# Patient Record
Sex: Female | Born: 1995 | Race: Black or African American | Hispanic: No | Marital: Single | Smoking: Never smoker
Health system: Southern US, Community
[De-identification: ages and names within clinical notes are randomized; demographics above are authoritative.]

## PROBLEM LIST (undated history)

## (undated) ENCOUNTER — Ambulatory Visit (HOSPITAL_COMMUNITY): Admission: EM | Payer: Self-pay

---

## 2021-08-22 DIAGNOSIS — Z20822 Contact with and (suspected) exposure to covid-19: Secondary | ICD-10-CM | POA: Diagnosis not present

## 2021-10-20 ENCOUNTER — Telehealth: Payer: Self-pay

## 2021-10-20 ENCOUNTER — Ambulatory Visit
Admission: EM | Admit: 2021-10-20 | Discharge: 2021-10-20 | Disposition: A | Discharge status: Home / Self Care | Payer: 59 | Attending: Emergency Medicine | Admitting: Emergency Medicine

## 2021-10-20 ENCOUNTER — Other Ambulatory Visit: Payer: Self-pay

## 2021-10-20 DIAGNOSIS — H5789 Other specified disorders of eye and adnexa: Secondary | ICD-10-CM

## 2021-10-20 DIAGNOSIS — H579 Unspecified disorder of eye and adnexa: Secondary | ICD-10-CM

## 2021-10-20 DIAGNOSIS — H16291 Other keratoconjunctivitis, right eye: Secondary | ICD-10-CM

## 2021-10-20 DIAGNOSIS — Z973 Presence of spectacles and contact lenses: Secondary | ICD-10-CM

## 2021-10-20 MED ORDER — PREDNISOLONE ACETATE 1 % OP SUSP: 1.0000 [drp] | Freq: Four times a day (QID) | OPHTHALMIC | 0 refills | Status: DC

## 2021-10-20 NOTE — ED Triage Notes (Signed)
Pt states that today when she woke up her right eye was reddened and she has had crusting around the eye lid. Patient does wear contacts.  Started: yesterday

## 2021-10-20 NOTE — Discharge Instructions (Addendum)
Please discontinue contact lens use and place 1 drop of Omnipred in your right eye 4 times daily until you are able to see your ophthalmologist.  Please make his appointment as soon as possible.  Thank you for visiting urgent care today.

## 2021-10-20 NOTE — ED Provider Notes (Signed)
[facility]    CSN: [id number] Arrival date & time: 10/20/21  6387      History   Chief Complaint No chief complaint on file.   HPI Erin Braun is a 25 y.o. female.   Patient reports waking up this morning with her right eye red along with crusting around both her upper and lower eyelids.  Patient states she has worn contacts for years.  Denies vision changes, eye pain, recent upper respiratory infection, trauma to the eye, headache, dizziness.  The history is provided by the patient.   History reviewed. No pertinent past medical history.  There are no problems to display for this patient.   History reviewed. No pertinent surgical history.  OB History   No obstetric history on file.      Home Medications    Prior to Admission medications   Medication Sig Start Date End Date Taking? Authorizing Provider  prednisoLONE acetate (OMNIPRED) 1 % ophthalmic suspension Place 1 drop into the right eye 4 (four) times daily. 10/20/21  Yes Theadora Rama Scales, PA-C    Family History No family history on file.  Social History Social History   Tobacco Use   Smoking status: Never   Smokeless tobacco: Never  Vaping Use   Vaping Use: Never used  Substance Use Topics   Alcohol use: Yes   Drug use: Yes    Types: Marijuana     Allergies   Patient has no allergy information on record.   Review of Systems Review of Systems Pertinent findings noted in history of present illness.    Physical Exam Triage Vital Signs ED Triage Vitals  Enc Vitals Group     BP 10/13/21 0827 (!) 147/82     Pulse Rate 10/13/21 0827 72     Resp 10/13/21 0827 18     Temp 10/13/21 0827 98.3 F (36.8 C)     Temp Source 10/13/21 0827 Oral     SpO2 10/13/21 0827 98 %     Weight --      Height --      Head Circumference --      Peak Flow --      Pain Score 10/13/21 0826 5     Pain Loc --      Pain Edu? --      Excl. in GC? --    No data found.  Updated Vital  Signs BP (!) 108/56 (BP Location: Left Arm)   Pulse 62   Temp 97.9 F (36.6 C) (Oral)   Resp 18   LMP 10/10/2021   SpO2 98%   Visual Acuity Right Eye Distance:   Left Eye Distance:   Bilateral Distance:    Right Eye Near:   Left Eye Near:    Bilateral Near:     Physical Exam Vitals and nursing note reviewed.  Constitutional:      General: She is not in acute distress.    Appearance: Normal appearance. She is not ill-appearing.  HENT:     Head: Normocephalic and atraumatic.  Eyes:     General: Lids are normal.        Right eye: Discharge present. No hordeolum.        Left eye: No discharge.     Extraocular Movements: Extraocular movements intact.     Conjunctiva/sclera:     Right eye: Right conjunctiva is injected.     Left eye: Left conjunctiva is not injected.     Comments: Contact lens  present in right eye, there is an injection of her sclera surrounding the contact lens but sparing the area beneath the contact lens and cornea.  Neck:     Trachea: Trachea and phonation normal.  Cardiovascular:     Rate and Rhythm: Normal rate and regular rhythm.     Pulses: Normal pulses.     Heart sounds: Normal heart sounds. No murmur heard.   No friction rub. No gallop.  Pulmonary:     Effort: Pulmonary effort is normal. No accessory muscle usage, prolonged expiration or respiratory distress.     Breath sounds: Normal breath sounds. No stridor, decreased air movement or transmitted upper airway sounds. No decreased breath sounds, wheezing, rhonchi or rales.  Chest:     Chest wall: No tenderness.  Musculoskeletal:        General: Normal range of motion.     Cervical back: Normal range of motion and neck supple. Normal range of motion.  Lymphadenopathy:     Cervical: No cervical adenopathy.  Skin:    General: Skin is warm and dry.     Findings: No erythema or rash.  Neurological:     General: No focal deficit present.     Mental Status: She is alert and oriented to person,  place, and time.  Psychiatric:        Mood and Affect: Mood normal.        Behavior: Behavior normal.     UC Treatments / Results  Labs (all labs ordered are listed, but only abnormal results are displayed) Labs Reviewed - No data to display  EKG   Radiology No results found.  Procedures Procedures (including critical care time)  Medications Ordered in UC Medications - No data to display  Initial Impression / Assessment and Plan / UC Course  I have reviewed the triage vital signs and the nursing notes.  Pertinent labs & imaging results that were available during my care of the patient were reviewed by me and considered in my medical decision making (see chart for details).     Superior limbic keratoconjunctivitis of right eye, mild.  I have provided her with an ophthalmic steroid drops to be used 4 times daily 1 drop.  Patient vies to stop wearing contact lenses and to reach out to her ophthalmologist immediately to make an appointment for follow-up evaluation.  Patient verbalized understanding and agreement of plan as discussed.  All questions were addressed during visit.  Please see discharge instructions below for further details of plan.  Final Clinical Impressions(s) / UC Diagnoses   Final diagnoses:  Eye problem  Red eye associated with contact lens  Superior limbic keratoconjunctivitis of right eye     Discharge Instructions      Please discontinue contact lens use and place 1 drop of Omnipred in your right eye 4 times daily until you are able to see your ophthalmologist.  Please make his appointment as soon as possible.  Thank you for visiting urgent care today.       ED Prescriptions     Medication Sig Dispense Auth. Provider   prednisoLONE acetate (OMNIPRED) 1 % ophthalmic suspension Place 1 drop into the right eye 4 (four) times daily. 5 mL Theadora Rama Scales, PA-C      PDMP not reviewed this encounter.    Theadora Rama Scales,  PA-C 10/20/21 1153

## 2021-11-28 ENCOUNTER — Ambulatory Visit
Admission: EM | Admit: 2021-11-28 | Discharge: 2021-11-28 | Disposition: A | Discharge status: Home / Self Care | Payer: 59 | Attending: Emergency Medicine | Admitting: Emergency Medicine

## 2021-11-28 ENCOUNTER — Other Ambulatory Visit: Payer: Self-pay

## 2021-11-28 DIAGNOSIS — J029 Acute pharyngitis, unspecified: Secondary | ICD-10-CM

## 2021-11-28 DIAGNOSIS — B3731 Acute candidiasis of vulva and vagina: Secondary | ICD-10-CM

## 2021-11-28 DIAGNOSIS — J020 Streptococcal pharyngitis: Secondary | ICD-10-CM

## 2021-11-28 DIAGNOSIS — M791 Myalgia, unspecified site: Secondary | ICD-10-CM

## 2021-11-28 DIAGNOSIS — Z113 Encounter for screening for infections with a predominantly sexual mode of transmission: Secondary | ICD-10-CM

## 2021-11-28 DIAGNOSIS — Z8616 Personal history of COVID-19: Secondary | ICD-10-CM

## 2021-11-28 LAB — POCT RAPID STREP A (OFFICE): Rapid Strep A Screen: POSITIVE — AB

## 2021-11-28 MED ORDER — CEFDINIR 300 MG PO CAPS
300.0000 mg | ORAL_CAPSULE | Freq: Two times a day (BID) | ORAL | 0 refills | Status: AC
Start: 2021-11-28 — End: 2021-12-08

## 2021-11-28 MED ORDER — FLUCONAZOLE 150 MG PO TABS
ORAL_TABLET | ORAL | 0 refills | Status: DC
Start: 2021-11-28 — End: 2022-01-16

## 2021-11-28 NOTE — Discharge Instructions (Addendum)
For strep throat infection, please begin cefdinir now, 1 tablet twice daily for the next 10 days.  While we await the results of your throat culture and your flu test.  The results of your flu test and STD screening will be posted initially to your MyChart and you will be contacted with those results if any of them are positive, further recommendations will be provided to you if needed.  Thank you for visiting urgent care today.  Your prescription for cefdinir should be ready to pick up in about an hour, please take 1 tablet twice daily for the next 10 days unless you are instructed to discontinue.

## 2021-11-28 NOTE — ED Provider Notes (Addendum)
[facility]    CSN: [id number] Arrival date & time: 11/28/21  0865    HISTORY   Chief Complaint  Patient presents with   Sore Throat   HPI Erin Braun is a 25 y.o. female. Pt present sore throat with difficulty swallowing symptoms that began 5 days ago, states throat feels very raw. Pt states she she did a home COVID test yesterday and it was negative.  Patient denies cough, nausea, vomiting, diarrhea, headache, body aches, chills.  Patient is afebrile on arrival today.  Patient states she had COVID-19 in September 2022.  Patient denies known exposure to STD, denies having oral sex with anyone in the past month.  Patient has a low blood pressure today but this is historical, it was 108/56 when she was here last.  The history is provided by the patient.  History reviewed. No pertinent past medical history. There are no problems to display for this patient.  History reviewed. No pertinent surgical history. OB History   No obstetric history on file.    Home Medications    Prior to Admission medications   Not on File   Family History History reviewed. No pertinent family history. Social History Social History   Tobacco Use   Smoking status: Never   Smokeless tobacco: Never  Vaping Use   Vaping Use: Never used  Substance Use Topics   Alcohol use: Yes   Drug use: Yes    Types: Marijuana   Allergies   Patient has no allergy information on record.  Review of Systems Review of Systems Pertinent findings noted in history of present illness.   Physical Exam Triage Vital Signs ED Triage Vitals  Enc Vitals Group     BP 10/13/21 0827 (!) 147/82     Pulse Rate 10/13/21 0827 72     Resp 10/13/21 0827 18     Temp 10/13/21 0827 98.3 F (36.8 C)     Temp Source 10/13/21 0827 Oral     SpO2 10/13/21 0827 98 %     Weight --      Height --      Head Circumference --      Peak Flow --      Pain Score 10/13/21 0826 5     Pain Loc --      Pain Edu? --       Excl. in GC? --   No data found.  Updated Vital Signs BP (!) 99/51 (BP Location: Left Arm)    Pulse 75    Temp 98.8 F (37.1 C) (Oral)    Resp 16    LMP 11/15/2021    SpO2 98%   Physical Exam Constitutional:      General: She is not in acute distress.    Appearance: She is well-developed and well-groomed. She is ill-appearing.  HENT:     Head: Normocephalic and atraumatic.     Jaw: No trismus, tenderness, swelling or pain on movement.     Salivary Glands: Right salivary gland is diffusely enlarged and tender. Left salivary gland is diffusely enlarged and tender.     Right Ear: Hearing, tympanic membrane and ear canal normal.     Left Ear: Hearing, tympanic membrane and ear canal normal.     Ears:     Comments: Bilateral EACs with mild erythema, bilateral TMs normal    Nose: Nose normal. No mucosal edema, congestion or rhinorrhea.     Right Turbinates: Not enlarged or swollen.  Left Turbinates: Not enlarged or swollen.     Right Sinus: No maxillary sinus tenderness or frontal sinus tenderness.     Left Sinus: No maxillary sinus tenderness or frontal sinus tenderness.     Mouth/Throat:     Pharynx: Posterior oropharyngeal erythema (Small papules appreciated on erythematous base at tonsillar pillars on both sides, tonsils are not enlarged and are without erythema or exudate) present.     Comments: Posterior pharynx with diffuse erythema, mild exudate.  Both tonsils enlarged with erythema and exudate Eyes:     General: Lids are normal. Vision grossly intact.     Extraocular Movements: Extraocular movements intact.     Conjunctiva/sclera: Conjunctivae normal.  Cardiovascular:     Rate and Rhythm: Normal rate and regular rhythm.     Pulses: Normal pulses.     Heart sounds: Normal heart sounds, S1 normal and S2 normal. Heart sounds not distant. No murmur heard.   No friction rub. No gallop. No S3 or S4 sounds.  Pulmonary:     Effort: Pulmonary effort is normal.     Breath  sounds: Normal breath sounds. No decreased breath sounds, wheezing, rhonchi or rales.  Musculoskeletal:        General: Normal range of motion.     Right lower leg: No edema.     Left lower leg: No edema.  Lymphadenopathy:     Cervical: Cervical adenopathy present.     Right cervical: Posterior cervical adenopathy (Mild) present.     Left cervical: Posterior cervical adenopathy (Mild) present.  Skin:    General: Skin is warm and dry.  Neurological:     General: No focal deficit present.     Mental Status: She is alert and oriented to person, place, and time.  Psychiatric:        Mood and Affect: Mood normal.        Behavior: Behavior normal.    Visual Acuity Right Eye Distance:   Left Eye Distance:   Bilateral Distance:    Right Eye Near:   Left Eye Near:    Bilateral Near:     UC Couse / Diagnostics / Procedures:    EKG  Radiology No results found.  Procedures Procedures (including critical care time)  UC Diagnoses / Final Clinical Impressions(s)   I have reviewed the triage vital signs and the nursing notes.  Pertinent labs & imaging results that were available during my care of the patient were reviewed by me and considered in my medical decision making (see chart for details).   Final diagnoses:  Screening examination for STD (sexually transmitted disease)  Acute pharyngitis, unspecified etiology  Myalgia  History of COVID-19  Streptococcal pharyngitis   Visual exam of posterior pharynx revealed papular lesions on diffusely if there are erythematous base and tonsillar pillars concerning for superficial infection not related to group A strep.  Tonsils were not involved. I have low suspicion for viral pharyngitis.  Patient was screened for syphilis and HIV.  To rule out viral infection, patient was tested for influenza (COVID-19 testing performed performed because he cannot order test separately) results should be available in 24 to 48 hours.  Rapid strep test today  was positive, patient will be treated with cefdinir.  Return precautions advised.  ED Prescriptions     Medication Sig Dispense Auth. Provider   cefdinir (OMNICEF) 300 MG capsule Take 1 capsule (300 mg total) by mouth 2 (two) times daily for 10 days. 20 capsule Theadora Rama Scales, PA-C  PDMP not reviewed this encounter.  Pending results:  Labs Reviewed  POCT RAPID STREP A (OFFICE) - Abnormal; Notable for the following components:      Result Value   Rapid Strep A Screen Positive (*)    All other components within normal limits  COVID-19, FLU A+B NAA  RPR  HIV ANTIBODY (ROUTINE TESTING W REFLEX)    Medications Ordered in UC: Medications - No data to display  Disposition Upon Discharge:  Condition: stable for discharge home Home: take medications as prescribed; routine discharge instructions as discussed; follow up as advised.  Patient presented with an acute illness with associated systemic symptoms and significant discomfort requiring urgent management. In my opinion, this is a condition that a prudent lay person (someone who possesses an average knowledge of health and medicine) may potentially expect to result in complications if not addressed urgently such as respiratory distress, impairment of bodily function or dysfunction of bodily organs.   Routine symptom specific, illness specific and/or disease specific instructions were discussed with the patient and/or caregiver at length.   As such, the patient has been evaluated and assessed, work-up was performed and treatment was provided in alignment with urgent care protocols and evidence based medicine.  Patient/parent/caregiver has been advised that the patient may require follow up for further testing and treatment if the symptoms continue in spite of treatment, as clinically indicated and appropriate.  The patient was tested for COVID-19, Influenza and/or RSV, then the patient/parent/guardian was advised to isolate at  home pending the results of his/her diagnostic coronavirus test and potentially longer if theyre positive. I have also advised pt that if his/her COVID-19 test returns positive, it's recommended to self-isolate for at least 10 days after symptoms first appeared AND until fever-free for 24 hours without fever reducer AND other symptoms have improved or resolved. Discussed self-isolation recommendations as well as instructions for household member/close contacts as per the [facility] and [place] [facility], and also gave patient the COVID packet with this information.  Patient/parent/caregiver has been advised to return to the [facility] or PCP in 3-5 days if no better; to PCP or the Emergency Department if new signs and symptoms develop, or if the current signs or symptoms continue to change or worsen for further workup, evaluation and treatment as clinically indicated and appropriate  The patient will follow up with their current PCP if and as advised. If the patient does not currently have a PCP we will assist them in obtaining one.   The patient may need specialty follow up if the symptoms continue, in spite of conservative treatment and management, for further workup, evaluation, consultation and treatment as clinically indicated and appropriate.  Patient/parent/caregiver verbalized understanding and agreement of plan as discussed.  All questions were addressed during visit.  Please see discharge instructions below for further details of plan.  Discharge Instructions:   Discharge Instructions      For strep throat infection, please begin cefdinir now, 1 tablet twice daily for the next 10 days.  While we await the results of your throat culture and your flu test.  The results of your flu test and STD screening will be posted initially to your MyChart and you will be contacted with those results if any of them are positive, further recommendations will be provided to you if needed.  Thank you for visiting urgent care  today.  Your prescription for cefdinir should be ready to pick up in about an hour, please take 1 tablet twice daily for  the next 10 days unless you are instructed to discontinue.        Theadora Rama Scales, PA-C 11/28/21 1031    Theadora Rama [place], [place] 11/28/21 1050

## 2021-11-28 NOTE — ED Triage Notes (Signed)
Pt present sore throat with difficulty swallowing symptoms started on Thursday. Pt recently was tested for covid, results were negative.

## 2021-11-29 LAB — COVID-19, FLU A+B NAA
Influenza A, NAA: NOT DETECTED
Influenza B, NAA: NOT DETECTED
SARS-CoV-2, NAA: NOT DETECTED

## 2021-11-29 LAB — RPR: RPR Ser Ql: NONREACTIVE

## 2021-11-29 LAB — HIV ANTIBODY (ROUTINE TESTING W REFLEX): HIV Screen 4th Generation wRfx: NONREACTIVE

## 2021-12-27 ENCOUNTER — Inpatient Hospital Stay (HOSPITAL_COMMUNITY)
Admission: AD | Admit: 2021-12-27 | Discharge: 2021-12-28 | Disposition: A | Discharge status: Home / Self Care | Payer: 59 | Attending: Obstetrics & Gynecology | Admitting: Obstetrics & Gynecology

## 2021-12-27 ENCOUNTER — Other Ambulatory Visit: Payer: Self-pay

## 2021-12-27 ENCOUNTER — Encounter (HOSPITAL_COMMUNITY): Payer: Self-pay | Admitting: Obstetrics & Gynecology

## 2021-12-27 DIAGNOSIS — Z3A01 Less than 8 weeks gestation of pregnancy: Secondary | ICD-10-CM | POA: Diagnosis not present

## 2021-12-27 DIAGNOSIS — Z3A00 Weeks of gestation of pregnancy not specified: Secondary | ICD-10-CM | POA: Diagnosis not present

## 2021-12-27 DIAGNOSIS — O3680X0 Pregnancy with inconclusive fetal viability, not applicable or unspecified: Secondary | ICD-10-CM

## 2021-12-27 DIAGNOSIS — O4690 Antepartum hemorrhage, unspecified, unspecified trimester: Secondary | ICD-10-CM

## 2021-12-27 DIAGNOSIS — O209 Hemorrhage in early pregnancy, unspecified: Secondary | ICD-10-CM | POA: Diagnosis not present

## 2021-12-27 LAB — POC URINE PREG, ED: Preg Test, Ur: POSITIVE — AB

## 2021-12-27 NOTE — MAU Note (Signed)
Pt report she thinks she is having a miscarriage.   Pt reports vaginal bleeding since yesterday.

## 2021-12-27 NOTE — ED Notes (Signed)
Report given to [facility].  Transport called.

## 2021-12-27 NOTE — MAU Provider Note (Signed)
Chief Complaint: Miscarriage   Event Date/Time   First Provider Initiated Contact with Patient 12/27/21 2334        SUBJECTIVE HPI: Erin Braun is a 25 y.o. G1P0 at [redacted]w[redacted]d  by LMP who presents to maternity admissions reporting vaginal bleeding and cramping.  Pregnancy test was positive. . She denies vaginal itching/burning, urinary symptoms, h/a, n/v, or fever/chills.   [phone]  Vaginal Bleeding The patient's primary symptoms include pelvic pain and vaginal bleeding. The patient's pertinent negatives include no genital itching, genital lesions or genital odor. This is a new problem. The current episode started in the past 7 days. The problem has been gradually improving. The pain is mild. She is pregnant. Pertinent negatives include no chills or fever. The vaginal discharge was bloody. The vaginal bleeding is typical of menses. She has been passing clots. She has not been passing tissue. Nothing aggravates the symptoms. She has tried nothing for the symptoms.   RN Note: Pt report she thinks she is having a miscarriage.   Pt reports vaginal bleeding since yesterday.   ED Note: vaginal bleeding and positive pregnancy test.  Bleeding started 2 days ago, patient reports bleeding has been persistent and heavy, passing large clots.  Has had some lightheadedness today but no syncope.  Reports that she took a pregnancy test at home which was positive but did not know she was pregnant prior to today.  Last menstrual period estimated to be 1-2 months ago.  No prior history of pregnancy or miscarriage.  History reviewed. No pertinent past medical history. History reviewed. No pertinent surgical history. Social History   Socioeconomic History   Marital status: Single    Spouse name: Not on file   Number of children: Not on file   Years of education: Not on file   Highest education level: Not on file  Occupational History   Not on file  Tobacco Use   Smoking status: Never   Smokeless  tobacco: Never  Vaping Use   Vaping Use: Never used  Substance and Sexual Activity   Alcohol use: Yes   Drug use: Yes    Types: Marijuana   Sexual activity: Yes  Other Topics Concern   Not on file  Social History Narrative   Not on file   Social Determinants of Health   Financial Resource Strain: Not on file  Food Insecurity: Not on file  Transportation Needs: Not on file  Physical Activity: Not on file  Stress: Not on file  Social Connections: Not on file  Intimate Partner Violence: Not on file   No current facility-administered medications on file prior to encounter.   Current Outpatient Medications on File Prior to Encounter  Medication Sig Dispense Refill   fluconazole (DIFLUCAN) 150 MG tablet Take 1 tablet today.  Take second tablet 3 days later. 2 tablet 0   No Known Allergies  I have reviewed patient's Past Medical Hx, Surgical Hx, Family Hx, Social Hx, medications and allergies.   ROS:  Review of Systems  Constitutional:  Negative for chills and fever.  Genitourinary:  Positive for pelvic pain and vaginal bleeding.  Review of Systems  Other systems negative   Physical Exam  Physical Exam Patient Vitals for the past 24 hrs:  BP Temp Temp src Pulse Resp SpO2  12/27/21 2318 112/75 -- -- 74 -- --  12/27/21 2317 -- 98.3 F (36.8 C) -- -- 12 97 %  12/27/21 2149 131/82 99.1 F (37.3 C) -- 86 16  100 %  12/27/21 2146 131/82 99.1 F (37.3 C) Oral 86 16 100 %   Constitutional: Well-developed, well-nourished female in no acute distress.  Cardiovascular: normal rate Respiratory: normal effort GI: Abd soft, non-tender. Pos BS x 4 MS: Extremities nontender, no edema, normal ROM Neurologic: Alert and oriented x 4.  GU: Neg CVAT.  PELVIC EXAM: Cervix pink, visually closed, without lesion, scant red discharge, vaginal walls and external genitalia normal Bimanual exam: Cervix 0/long/high, firm, anterior, neg CMT, uterus nontender, nonenlarged, adnexa without  tenderness, enlargement, or mass  LAB RESULTS Results for orders placed or performed during the hospital encounter of 12/27/21 (from the past 24 hour(s))  POC Urine Pregnancy, ED (not at  [facility])     Status: Abnormal   Collection Time: 12/27/21 10:11 PM  Result Value Ref Range   Preg Test, Ur POSITIVE (A) NEGATIVE  CBC     Status: Abnormal   Collection Time: 12/27/21 11:25 PM  Result Value Ref Range   WBC 11.9 (H) 4.0 - 10.5 K/uL   RBC 4.05 3.87 - 5.11 MIL/uL   Hemoglobin 12.2 12.0 - 15.0 g/dL   HCT 51.8 (L) 84.1 - 66.0 %   MCV 88.4 80.0 - 100.0 fL   MCH 30.1 26.0 - 34.0 pg   MCHC 34.1 30.0 - 36.0 g/dL   RDW 63.0 16.0 - 10.9 %   Platelets 247 150 - 400 K/uL   nRBC 0.0 0.0 - 0.2 %  Comprehensive metabolic panel     Status: Abnormal   Collection Time: 12/27/21 11:25 PM  Result Value Ref Range   Sodium 134 (L) 135 - 145 mmol/L   Potassium 3.6 3.5 - 5.1 mmol/L   Chloride 102 98 - 111 mmol/L   CO2 22 22 - 32 mmol/L   Glucose, Bld 112 (H) 70 - 99 mg/dL   BUN 6 6 - 20 mg/dL   Creatinine, Ser 3.23 0.44 - 1.00 mg/dL   Calcium 9.1 8.9 - 55.7 mg/dL   Total Protein 7.0 6.5 - 8.1 g/dL   Albumin 3.8 3.5 - 5.0 g/dL   AST 16 15 - 41 U/L   ALT 13 0 - 44 U/L   Alkaline Phosphatase 30 (L) 38 - 126 U/L   Total Bilirubin 0.9 0.3 - 1.2 mg/dL   GFR, Estimated >32 >20 mL/min   Anion gap 10 5 - 15  ABO/Rh     Status: None   Collection Time: 12/27/21 11:25 PM  Result Value Ref Range   ABO/RH(D) B POS    No rh immune globuloin      NOT A RH IMMUNE GLOBULIN CANDIDATE, PT RH POSITIVE Performed at [facility], [address] [place], [place] [address]   hCG, quantitative, pregnancy     Status: Abnormal   Collection Time: 12/27/21 11:25 PM  Result Value Ref Range   hCG, Beta Chain, Quant, S 3,255 (H) <5 mIU/mL  Wet prep, genital     Status: None   Collection Time: 12/27/21 11:43 PM   Specimen: Vaginal  Result Value Ref Range   Yeast Wet Prep HPF POC NONE SEEN NONE SEEN   Trich, Wet Prep  NONE SEEN NONE SEEN   Clue Cells Wet Prep HPF POC NONE SEEN NONE SEEN   WBC, Wet Prep HPF POC <10 <10   Sperm NONE SEEN      IMAGING US OB LESS THAN 14 WEEKS WITH OB TRANSVAGINAL  Result Date: 12/28/2021 CLINICAL DATA:  Vaginal bleeding.  Pregnancy of unknown  location EXAM: OBSTETRIC <14 WK US AND TRANSVAGINAL OB US TECHNIQUE: Both transabdominal and transvaginal ultrasound examinations were performed for complete evaluation of the gestation as well as the maternal uterus, adnexal regions, and pelvic cul-de-sac. Transvaginal technique was performed to assess early pregnancy. COMPARISON:  None. FINDINGS: Intrauterine gestational sac: None Yolk sac:  Not Visualized. Embryo:  Not Visualized. Cardiac Activity: Not Visualized. Heart Rate:   bpm MSD:   mm    w     d CRL:    mm    w    d                  US EDC: Subchorionic hemorrhage:  None visualized. Maternal uterus/adnexae: No adnexal mass or free fluid. IMPRESSION: No intrauterine pregnancy visualized. Differential considerations would include early intrauterine pregnancy too early to visualize, spontaneous abortion, or occult ectopic pregnancy. Recommend close clinical followup and serial quantitative beta HCGs and ultrasounds. Electronically Signed   By: Charlett NoseKevin  Dover M.D.   On: 12/28/2021 00:41     MAU Management/MDM: Ordered usual first trimester r/o ectopic labs.   Pelvic exam and cultures done Will check baseline Ultrasound to rule out ectopic.  This bleeding/pain can represent a normal pregnancy with bleeding, spontaneous abortion or even an ectopic which can be life-threatening.  The process as listed above helps to determine which of these is present.  Reviewed results.  Really cannot discern prognosis until we see another HCG level  ASSESSMENT 1. Vaginal bleeding in pregnancy   2. Bleeding in early pregnancy   3. Pregnancy of unknown anatomic location     PLAN Discharge home Plan to repeat HCG level in 48 hours  Will repeat   Ultrasound in about 7-10 days if HCG levels double appropriately  Ectopic precautions   Pt stable at time of discharge. Encouraged to return here if she develops worsening of symptoms, increase in pain, fever, or other concerning symptoms.    Wynelle BourgeoisMarie Kamdin Follett CNM, MSN Certified Nurse-Midwife 12/27/2021  11:34 PM

## 2021-12-27 NOTE — ED Provider Notes (Signed)
Emergency Medicine Provider OB Triage Evaluation Note  Erin Braun is a 26 y.o. female, No obstetric history on file., at Unknown gestation who presents to the emergency department with complaints of vaginal bleeding and positive pregnancy test.  Bleeding started 2 days ago, patient reports bleeding has been persistent and heavy, passing large clots.  Has had some lightheadedness today but no syncope.  Reports that she took a pregnancy test at home which was positive but did not know she was pregnant prior to today.  Last menstrual period estimated to be 1-2 months ago.  No prior history of pregnancy or miscarriage.  Review of  Systems  Positive: Vaginal bleeding, lower abdominal cramping, lightheadedness Negative: Fever, chills, syncope  Physical Exam  BP 131/82    Pulse 86    Temp 99.1 F (37.3 C)    Resp 16    SpO2 100%  General: Awake, no distress  HEENT: Atraumatic  Resp: Normal effort  Cardiac: Normal rate Abd: Nondistended, nontender  MSK: Moves all extremities without difficulty Neuro: Speech clear  Medical Decision Making  Pt evaluated for pregnancy concern and is stable for transfer to MAU. Pt is in agreement with plan for transfer.  10:26 PM Discussed with MAU APP, Lelan Pons, who accepts patient in transfer.  Clinical Impression   1. Vaginal bleeding in pregnancy        Erin Braun 12/27/21 2229    Erin Belling, MD 12/27/21 [phone]

## 2021-12-28 ENCOUNTER — Inpatient Hospital Stay (HOSPITAL_COMMUNITY): Payer: 59

## 2021-12-28 DIAGNOSIS — Z3A00 Weeks of gestation of pregnancy not specified: Secondary | ICD-10-CM | POA: Diagnosis not present

## 2021-12-28 DIAGNOSIS — O4690 Antepartum hemorrhage, unspecified, unspecified trimester: Secondary | ICD-10-CM | POA: Diagnosis not present

## 2021-12-28 DIAGNOSIS — O209 Hemorrhage in early pregnancy, unspecified: Secondary | ICD-10-CM | POA: Diagnosis not present

## 2021-12-28 DIAGNOSIS — Z3A01 Less than 8 weeks gestation of pregnancy: Secondary | ICD-10-CM | POA: Diagnosis not present

## 2021-12-28 LAB — COMPREHENSIVE METABOLIC PANEL
ALT: 13 U/L (ref 0–44)
AST: 16 U/L (ref 15–41)
Albumin: 3.8 g/dL (ref 3.5–5.0)
Alkaline Phosphatase: 30 U/L — ABNORMAL LOW (ref 38–126)
Anion gap: 10 (ref 5–15)
BUN: 6 mg/dL (ref 6–20)
CO2: 22 mmol/L (ref 22–32)
Calcium: 9.1 mg/dL (ref 8.9–10.3)
Chloride: 102 mmol/L (ref 98–111)
Creatinine, Ser: 0.7 mg/dL (ref 0.44–1.00)
GFR, Estimated: >60 mL/min (ref >60)
Glucose, Bld: 112 mg/dL — ABNORMAL HIGH (ref 70–99)
Potassium: 3.6 mmol/L (ref 3.5–5.1)
Sodium: 134 mmol/L — ABNORMAL LOW (ref 135–145)
Total Bilirubin: 0.9 mg/dL (ref 0.3–1.2)
Total Protein: 7 g/dL (ref 6.5–8.1)

## 2021-12-28 LAB — CBC
HCT: 35.8 % — ABNORMAL LOW (ref 36.0–46.0)
Hemoglobin: 12.2 g/dL (ref 12.0–15.0)
MCH: 30.1 pg (ref 26.0–34.0)
MCHC: 34.1 g/dL (ref 30.0–36.0)
MCV: 88.4 fL (ref 80.0–100.0)
Platelets: 247 10*3/uL (ref 150–400)
RBC: 4.05 MIL/uL (ref 3.87–5.11)
RDW: 12.4 % (ref 11.5–15.5)
WBC: 11.9 10*3/uL — ABNORMAL HIGH (ref 4.0–10.5)
nRBC: 0 % (ref 0.0–0.2)

## 2021-12-28 LAB — WET PREP, GENITAL
Clue Cells Wet Prep HPF POC: NONE SEEN
Sperm: NONE SEEN
Trich, Wet Prep: NONE SEEN
WBC, Wet Prep HPF POC: <10 (ref <10)
Yeast Wet Prep HPF POC: NONE SEEN

## 2021-12-28 LAB — GC/CHLAMYDIA PROBE AMP (~~LOC~~) NOT AT ARMC
Chlamydia: NEGATIVE
Comment: NEGATIVE
Comment: NORMAL
Neisseria Gonorrhea: NEGATIVE

## 2021-12-28 LAB — ABO/RH: ABO/RH(D): B POS

## 2021-12-28 LAB — HCG, QUANTITATIVE, PREGNANCY: hCG, Beta Chain, Quant, S: 3255 m[IU]/mL — ABNORMAL HIGH (ref <5)

## 2021-12-28 NOTE — MAU Note (Signed)
Pt unsure of LMP.  Discharge instructions and follow up care discussed. Pt verbalized understanding.

## 2021-12-30 ENCOUNTER — Inpatient Hospital Stay (HOSPITAL_COMMUNITY)
Admission: AD | Admit: 2021-12-30 | Discharge: 2021-12-30 | Disposition: A | Discharge status: Home / Self Care | Payer: 59 | Attending: Obstetrics and Gynecology | Admitting: Obstetrics and Gynecology

## 2021-12-30 ENCOUNTER — Other Ambulatory Visit: Payer: Self-pay

## 2021-12-30 DIAGNOSIS — Z6790 Unspecified blood type, Rh positive: Secondary | ICD-10-CM | POA: Diagnosis not present

## 2021-12-30 DIAGNOSIS — O039 Complete or unspecified spontaneous abortion without complication: Secondary | ICD-10-CM | POA: Insufficient documentation

## 2021-12-30 DIAGNOSIS — Z3A01 Less than 8 weeks gestation of pregnancy: Secondary | ICD-10-CM

## 2021-12-30 LAB — HCG, QUANTITATIVE, PREGNANCY: hCG, Beta Chain, Quant, S: 416 m[IU]/mL — ABNORMAL HIGH (ref <5)

## 2021-12-30 NOTE — MAU Provider Note (Signed)
Subjective:  Erin Braun is a 26 y.o. G1P0 at [redacted]w[redacted]d who presents today for FU BHCG. She was seen on 12/27/2021. Results from that day show no IUP on [place], and HCG 3,255. She reports vaginal bleeding that is like a period and describes it as a medium flow. Patient reports she is not changing pads often during the day because they are not full. Patient states the bleeding is significantly less than when she was last seen in MAU. She denies abdominal or pelvic pain at this time, but reports she does get occasional cramping.   Objective:  Physical Exam  Nursing note and vitals reviewed.  Patient Vitals for the past 24 hrs:  BP Temp Temp src Pulse Resp SpO2 Height Weight  12/30/21 0944 112/75 98.5 F (36.9 C) Oral 62 16 99 % 5\' 4"  (1.626 m) 73.6 kg   Constitutional: She is oriented to person, place, and time. She appears well-developed and well-nourished. No distress.  HENT:  Head: Normocephalic.  Cardiovascular: Normal rate.  Respiratory: Effort normal.  GI: Soft. There is no tenderness.  Neurological: She is alert and oriented to person, place, and time. Skin: Skin is warm and dry.  Psychiatric: She has a normal mood and affect.   Results for orders placed or performed during the hospital encounter of 12/30/21 (from the past 24 hour(s))  hCG, quantitative, pregnancy     Status: Abnormal   Collection Time: 12/30/21 10:02 AM  Result Value Ref Range   hCG, Beta Chain, Quant, S 416 (H) <5 mIU/mL   Assessment/Plan: Miscarriage HCG drop >50% FU in 1 weeks for: repeat hCG to follow to zero Pt does not have OB/GYN and will f/u with [facility] Message sent to [facility] to schedule 1 week hCG and 2 week HCG + Provider Visit Pt reports she has support from family/friends at home and declines to speak with a chaplain or social worker at this time Return MAU precautions given Pt discharged to home in stable condition  Kristie Bracewell, [facility], NP  10:59 AM 12/30/2021

## 2021-12-30 NOTE — MAU Note (Signed)
Here for repeat blood work.  Continues to have  bleeding, "like a regular period".. having some mild cramping.

## 2021-12-31 ENCOUNTER — Telehealth: Payer: Self-pay | Admitting: Radiology

## 2021-12-31 NOTE — Telephone Encounter (Signed)
Spoke with patient, scheduled appointments for lab visit and SAB FU with Dr Dione Plover

## 2022-01-04 ENCOUNTER — Other Ambulatory Visit: Payer: Self-pay

## 2022-01-04 DIAGNOSIS — O039 Complete or unspecified spontaneous abortion without complication: Secondary | ICD-10-CM

## 2022-01-08 ENCOUNTER — Other Ambulatory Visit: Payer: 59

## 2022-01-09 ENCOUNTER — Other Ambulatory Visit: Payer: Self-pay

## 2022-01-09 ENCOUNTER — Other Ambulatory Visit: Payer: 59

## 2022-01-09 DIAGNOSIS — O039 Complete or unspecified spontaneous abortion without complication: Secondary | ICD-10-CM | POA: Diagnosis not present

## 2022-01-10 LAB — BETA HCG QUANT (REF LAB): hCG Quant: 17 m[IU]/mL

## 2022-01-11 ENCOUNTER — Encounter: Payer: Self-pay | Admitting: Family Medicine

## 2022-01-15 ENCOUNTER — Ambulatory Visit: Payer: 59 | Admitting: Obstetrics and Gynecology

## 2022-01-16 ENCOUNTER — Ambulatory Visit (INDEPENDENT_AMBULATORY_CARE_PROVIDER_SITE_OTHER): Payer: 59 | Admitting: Family Medicine

## 2022-01-16 ENCOUNTER — Encounter: Payer: Self-pay | Admitting: Family Medicine

## 2022-01-16 ENCOUNTER — Other Ambulatory Visit: Payer: Self-pay

## 2022-01-16 VITALS — BP 145/77 | HR 66 | Ht 64.0 in | Wt 162.0 lb

## 2022-01-16 DIAGNOSIS — O039 Complete or unspecified spontaneous abortion without complication: Secondary | ICD-10-CM | POA: Diagnosis not present

## 2022-01-16 NOTE — Assessment & Plan Note (Signed)
Reassured patient that miscarriage is common with ~1/4 of women experiencing it in their lifetime. Patient interested in birth control, discussed that until hormone level goes to normal can't do anything, repeat hcg drawn today, last check was 17, will likely be normal. Reviewed contraception options in detail, she is leaning towards Nexplanon, schedule for follow up visit in a few weeks. Will also need a pap at that time.

## 2022-01-16 NOTE — Progress Notes (Signed)
GYNECOLOGY OFFICE VISIT NOTE  History:   Erin Braun is a 26 y.o. G1P0 here today for SAB follow up.  Patient initially seen in the MAU on 12/27/2021 At that time hcg was 3,255, reporting pain and vaginal bleeding, [place] without any IUP or other findings Returned on 12/30/2021, hcg had dropped to 416 c/w miscarriage Blood type B+ Last hcg was done 01/09/2022, hcg was 17  Today patient reports she is interested in birth control Feels fine No bleeding   Health Maintenance Due  Topic Date Due   HPV VACCINES (1 - 2-dose series) Never done   Hepatitis C Screening  Never done   TETANUS/TDAP  Never done   PAP-Cervical Cytology Screening  Never done   PAP SMEAR-Modifier  Never done    History reviewed. No pertinent past medical history.  History reviewed. No pertinent surgical history.  The following portions of the patient's history were reviewed and updated as appropriate: allergies, current medications, past family history, past medical history, past social history, past surgical history and problem list.   Health Maintenance:   Last pap: No results found for: DIAGPAP, HPV, HPVHIGH *needs*  Last mammogram:  N/a   Review of Systems:  Pertinent items noted in HPI and remainder of comprehensive ROS otherwise negative.  Physical Exam:  BP (!) 145/77    Pulse 66    Ht 5\' 4"  (1.626 m)    Wt 162 lb (73.5 kg)    LMP  (LMP Unknown)    Breastfeeding No    BMI 27.81 kg/m  CONSTITUTIONAL: Well-developed, well-nourished female in no acute distress.  HEENT:  Normocephalic, atraumatic. External right and left ear normal. No scleral icterus.  NECK: Normal range of motion, supple, no masses noted on observation SKIN: No rash noted. Not diaphoretic. No erythema. No pallor. MUSCULOSKELETAL: Normal range of motion. No edema noted. NEUROLOGIC: Alert and oriented to person, place, and time. Normal muscle tone coordination.  PSYCHIATRIC: Normal mood and affect. Normal behavior. Normal  judgment and thought content. RESPIRATORY: Effort normal, no problems with respiration noted   Labs and Imaging No results found for this or any previous visit (from the past 168 hour(s)). OB LESS THAN 14 WEEKS WITH OB TRANSVAGINAL  Result Date: 12/28/2021 CLINICAL DATA:  Vaginal bleeding.  Pregnancy of unknown location EXAM: OBSTETRIC <14 WK 02/25/2022 AND TRANSVAGINAL OB US TECHNIQUE: Both transabdominal and transvaginal ultrasound examinations were performed for complete evaluation of the gestation as well as the maternal uterus, adnexal regions, and pelvic cul-de-sac. Transvaginal technique was performed to assess early pregnancy. COMPARISON:  None. FINDINGS: Intrauterine gestational sac: None Yolk sac:  Not Visualized. Embryo:  Not Visualized. Cardiac Activity: Not Visualized. Heart Rate:   bpm MSD:   mm    w     d CRL:    mm    w    d                  [place] EDC: Subchorionic hemorrhage:  None visualized. Maternal uterus/adnexae: No adnexal mass or free fluid. IMPRESSION: No intrauterine pregnancy visualized. Differential considerations would include early intrauterine pregnancy too early to visualize, spontaneous abortion, or occult ectopic pregnancy. Recommend close clinical followup and serial quantitative beta HCGs and ultrasounds. Electronically Signed   By: [place] M.D.   On: 12/28/2021 00:41      Assessment and Plan:   Problem List Items Addressed This Visit       Other   Miscarriage - Primary  Reassured patient that miscarriage is common with ~1/4 of women experiencing it in their lifetime. Patient interested in birth control, discussed that until hormone level goes to normal can't do anything, repeat hcg drawn today, last check was 17, will likely be normal. Reviewed contraception options in detail, she is leaning towards Nexplanon, schedule for follow up visit in a few weeks. Will also need a pap at that time.        Relevant Orders   Beta hCG quant (ref lab)    Routine  preventative health maintenance measures emphasized. Please refer to After Visit Summary for other counseling recommendations.   Return in about 4 weeks (around 02/13/2022) for nexplanon insertion.    Total face-to-face time with patient: 20 minutes.  Over 50% of encounter was spent on counseling and coordination of care.   Venora Maples, MD/MPH Attending Family Medicine Physician, [facility] for [facility], [facility]

## 2022-01-17 ENCOUNTER — Telehealth: Payer: Self-pay

## 2022-01-17 DIAGNOSIS — O039 Complete or unspecified spontaneous abortion without complication: Secondary | ICD-10-CM

## 2022-01-17 LAB — BETA HCG QUANT (REF LAB): hCG Quant: 7 m[IU]/mL

## 2022-01-17 NOTE — Telephone Encounter (Addendum)
-----   Message from Erin Maples, MD sent at 01/17/2022  8:21 AM EST ----- Needs one more check, have patient return in one week  Called pt and unable to leave a message due to message stating "voicemail box not set up yet."  Addison Naegeli, RN  01/17/22

## 2022-01-18 NOTE — Telephone Encounter (Signed)
Called pt for second attempt. VM not set up. MyChart message sent.

## 2022-01-18 NOTE — Addendum Note (Signed)
Addended by: Annabell Howells on: 01/18/2022 01:56 PM   Modules accepted: Orders

## 2022-01-23 ENCOUNTER — Other Ambulatory Visit: Payer: Self-pay

## 2022-01-23 ENCOUNTER — Other Ambulatory Visit: Payer: 59

## 2022-01-23 ENCOUNTER — Encounter: Payer: Self-pay | Admitting: Family Medicine

## 2022-01-23 DIAGNOSIS — O039 Complete or unspecified spontaneous abortion without complication: Secondary | ICD-10-CM

## 2022-01-24 LAB — BETA HCG QUANT (REF LAB): hCG Quant: 3 m[IU]/mL

## 2022-02-14 ENCOUNTER — Ambulatory Visit (INDEPENDENT_AMBULATORY_CARE_PROVIDER_SITE_OTHER): Payer: 59 | Admitting: Family Medicine

## 2022-02-14 ENCOUNTER — Other Ambulatory Visit: Payer: Self-pay

## 2022-02-14 ENCOUNTER — Encounter: Payer: Self-pay | Admitting: Family Medicine

## 2022-02-14 VITALS — BP 122/76 | HR 68 | Wt 163.5 lb

## 2022-02-14 DIAGNOSIS — Z30017 Encounter for initial prescription of implantable subdermal contraceptive: Secondary | ICD-10-CM | POA: Diagnosis not present

## 2022-02-14 DIAGNOSIS — F419 Anxiety disorder, unspecified: Secondary | ICD-10-CM

## 2022-02-14 LAB — POCT PREGNANCY, URINE: Preg Test, Ur: NEGATIVE

## 2022-02-14 MED ORDER — ETONOGESTREL 68 MG ~~LOC~~ IMPL
68.0000 mg | DRUG_IMPLANT | Freq: Once | SUBCUTANEOUS | Status: AC
  Administered 2022-02-14: 68 mg via SUBCUTANEOUS

## 2022-02-14 NOTE — Addendum Note (Signed)
Addended by: Jake Seats on: 02/14/2022 03:36 PM ? ? Modules accepted: Orders ? ?

## 2022-02-14 NOTE — Progress Notes (Signed)
? ? ? ?  GYNECOLOGY OFFICE PROCEDURE NOTE ? ?Erin Braun is a 26 y.o. G1P0 here for Nexplanon insertion.  Last pap smear was 2021 ? and was normal per patient. No other gynecologic concerns. ? ?Nexplanon insertion Procedure ?Patient identified, informed consent performed, consent signed.   Patient does understand that irregular bleeding is a very common side effect of this medication. She was advised to have backup contraception for one week after placement. Pregnancy test in clinic today was negative.  Appropriate time out taken.  Patient's left arm was prepped and draped in the usual sterile fashion. The ruler used to measure and mark insertion area.  Patient was prepped with alcohol swab and then injected with 3 ml of 1% lidocaine.  She was prepped with betadine, Nexplanon removed from packaging,  Device confirmed in needle, then inserted full length of needle and withdrawn per handbook instructions. Nexplanon was able to palpated in the patient's arm; patient palpated the insert herself. There was minimal blood loss.  Patient insertion site covered with guaze and a pressure bandage to reduce any bruising.  The patient tolerated the procedure well and was given post procedure instructions. ? ?Clarnce Flock, MD/MPH ?Family Medicine, Faculty Practice ?[facility] [place] ? ?

## 2022-05-21 ENCOUNTER — Encounter: Payer: Self-pay | Admitting: Emergency Medicine

## 2022-05-21 ENCOUNTER — Ambulatory Visit
Admission: EM | Admit: 2022-05-21 | Discharge: 2022-05-21 | Disposition: A | Discharge status: Home / Self Care | Payer: 59 | Attending: Nurse Practitioner | Admitting: Nurse Practitioner

## 2022-05-21 DIAGNOSIS — J029 Acute pharyngitis, unspecified: Secondary | ICD-10-CM | POA: Insufficient documentation

## 2022-05-21 DIAGNOSIS — B340 Adenovirus infection, unspecified: Secondary | ICD-10-CM | POA: Insufficient documentation

## 2022-05-21 DIAGNOSIS — R0981 Nasal congestion: Secondary | ICD-10-CM | POA: Diagnosis not present

## 2022-05-21 LAB — POCT RAPID STREP A (OFFICE): Rapid Strep A Screen: NEGATIVE

## 2022-05-21 MED ORDER — FLUTICASONE PROPIONATE 50 MCG/ACT NA SUSP: 2.0000 | Freq: Every day | NASAL | 0 refills | Status: AC

## 2022-05-21 MED ORDER — PROMETHAZINE-DM 6.25-15 MG/5ML PO SYRP: 5.0000 mL | ORAL_SOLUTION | Freq: Four times a day (QID) | ORAL | 0 refills | Status: AC | PRN

## 2022-05-21 MED ORDER — PSEUDOEPHEDRINE HCL ER 120 MG PO TB12: 120.0000 mg | ORAL_TABLET | Freq: Two times a day (BID) | ORAL | 0 refills | Status: AC

## 2022-05-21 NOTE — Discharge Instructions (Signed)
Your symptoms are likely due to a viral respiratory infection. A respiratory infection is an illness that affects part of the respiratory system, such as the lungs, nose, or throat. Antibiotic medicines are not prescribed for viral infections. This is because antibiotics are designed to kill bacteria. They do not kill viruses. Take medications as prescribed to help manage your symptoms. You may take tylenol or ibuprofen as needed for fevers/headache/body aches. Drink plenty of fluids. Go to the ED immediately if you get worse or have any other symptoms.    Feel better soon!  Lelon Mast, FNP-C

## 2022-05-21 NOTE — ED Provider Notes (Signed)
[facility]    CSN: [id number] Arrival date & time: 05/21/22  1631      History   Chief Complaint Chief Complaint  Patient presents with   Cough   Sore Throat   Sneezing    HPI Erin Braun is a 26 y.o. female.   Subjective:    Erin Braun is a 26 y.o. female who presents for evaluation of symptoms of a URI. Symptoms include hot and cold spells, dry cough, itching in eyes, watery eyes, nasal blockage, nasal congestion, and sore throat. Onset of symptoms was 2 days ago and gradually worsening since that time. She denies any fevers, achiness, headache, or shortness of breath.  She is drinking plenty of fluids. Evaluation to date: none. Treatment to date: none. She denies any sick contacts. Negative at home COVID test earlier today.   The following portions of the patient's history were reviewed and updated as appropriate: allergies, current medications, past family history, past medical history, past social history, past surgical history, and problem list.        History reviewed. No pertinent past medical history.  Patient Active Problem List   Diagnosis Date Noted   Miscarriage 01/16/2022    History reviewed. No pertinent surgical history.  OB History     Gravida  1   Para      Term      Preterm      AB      Living         SAB      IAB      Ectopic      Multiple      Live Births               Home Medications    Prior to Admission medications   Medication Sig Start Date End Date Taking? Authorizing Provider  fluticasone (FLONASE) 50 MCG/ACT nasal spray Place 2 sprays into both nostrils daily. 05/21/22  Yes Lurline IdolMurrill, Estrellita Lasky, FNP  promethazine-dextromethorphan (PROMETHAZINE-DM) 6.25-15 MG/5ML syrup Take 5 mLs by mouth 4 (four) times daily as needed for cough. 05/21/22  Yes Lurline IdolMurrill, Pahola Dimmitt, FNP  pseudoephedrine (SUDAFED 12 HOUR) 120 MG 12 hr tablet Take 1 tablet (120 mg total) by mouth 2 (two) times daily. 05/21/22  Yes  Lurline IdolMurrill, Birgitta Uhlir, FNP    Family History History reviewed. No pertinent family history.  Social History Social History   Tobacco Use   Smoking status: Never   Smokeless tobacco: Never  Vaping Use   Vaping Use: Never used  Substance Use Topics   Alcohol use: Yes   Drug use: Yes    Types: Marijuana     Allergies   Patient has no known allergies.   Review of Systems Review of Systems  Constitutional:  Positive for chills. Negative for fever.  HENT:  Positive for congestion and sore throat. Negative for postnasal drip, rhinorrhea, sinus pressure and sneezing.   Eyes:  Positive for discharge and itching.  Respiratory:  Positive for cough. Negative for shortness of breath.   Gastrointestinal:  Negative for diarrhea, nausea and vomiting.  Musculoskeletal:  Negative for myalgias.  Neurological:  Negative for headaches.  All other systems reviewed and are negative.   Physical Exam Triage Vital Signs ED Triage Vitals  Enc Vitals Group     BP 05/21/22 2007 118/76     Pulse Rate 05/21/22 2007 71     Resp --      Temp 05/21/22 2007 99.1 F (37.3 C)  Temp Source 05/21/22 2007 Oral     SpO2 05/21/22 2007 99 %     Weight --      Height --      Head Circumference --      Peak Flow --      Pain Score 05/21/22 2017 4     Pain Loc --      Pain Edu? --      Excl. in GC? --    No data found.  Updated Vital Signs BP 118/76 (BP Location: Right Arm)   Pulse 71   Temp 99.1 F (37.3 C) (Oral)   Resp 20   LMP  (LMP Unknown)   SpO2 99%   Visual Acuity Right Eye Distance:   Left Eye Distance:   Bilateral Distance:    Right Eye Near:   Left Eye Near:    Bilateral Near:     Physical Exam Vitals reviewed.  Constitutional:      General: She is not in acute distress.    Appearance: She is well-developed. She is not ill-appearing, toxic-appearing or diaphoretic.  HENT:     Head: Normocephalic.     Right Ear: Tympanic membrane and ear canal normal.     Left Ear:  Tympanic membrane and ear canal normal.     Nose: Congestion present.     Mouth/Throat:     Mouth: Mucous membranes are moist. No oral lesions.     Pharynx: Posterior oropharyngeal erythema present. No pharyngeal swelling, oropharyngeal exudate or uvula swelling.     Tonsils: No tonsillar exudate or tonsillar abscesses.  Eyes:     Conjunctiva/sclera: Conjunctivae normal.     Pupils: Pupils are equal, round, and reactive to light.  Cardiovascular:     Rate and Rhythm: Normal rate.     Heart sounds: Normal heart sounds.  Pulmonary:     Effort: Pulmonary effort is normal.     Breath sounds: Normal breath sounds.  Abdominal:     Palpations: Abdomen is soft.  Musculoskeletal:     Cervical back: Normal range of motion and neck supple.  Lymphadenopathy:     Cervical: Cervical adenopathy present.  Skin:    General: Skin is warm and dry.  Neurological:     General: No focal deficit present.     Mental Status: She is alert and oriented to person, place, and time.  Psychiatric:        Mood and Affect: Mood normal.        Behavior: Behavior normal.     UC Treatments / Results  Labs (all labs ordered are listed, but only abnormal results are displayed) Labs Reviewed  CULTURE, GROUP A STREP [facility])  POCT RAPID STREP A (OFFICE)    EKG   Radiology No results found.  Procedures Procedures (including critical care time)  Medications Ordered in UC Medications - No data to display  Initial Impression / Assessment and Plan / UC Course  I have reviewed the triage vital signs and the nursing notes.  Pertinent labs & imaging results that were available during my care of the patient were reviewed by me and considered in my medical decision making (see chart for details).    26 year old female presenting with cough, nasal congestion, sore throat, watery/itching eyes and dry cough.  She denies any fevers, myalgias, headache or shortness of breath.  She is drinking plenty of fluids.  She  denies any sick contacts and had a negative home COVID test earlier today.  Has not  tried anything for her symptoms.  Patient has a low-grade fever 99.1.  All other vitals are unremarkable.  Rapid strep negative.  Throat culture is pending.  Symptoms likely due to an acute viral process. Discussed diagnosis and treatment of URI. Discussed the importance of avoiding unnecessary antibiotic therapy. Suggested symptomatic OTC remedies. Nasal saline spray for congestion. Follow up as needed.  Today's evaluation has revealed no signs of a dangerous process. Discussed diagnosis with patient and/or guardian. Patient and/or guardian aware of their diagnosis, possible red flag symptoms to watch out for and need for close follow up. Patient and/or guardian understands verbal and written discharge instructions. Patient and/or guardian comfortable with plan and disposition.  Patient and/or guardian has a clear mental status at this time, good insight into illness (after discussion and teaching) and has clear judgment to make decisions regarding their care  Documentation was completed with the aid of voice recognition software. Transcription may contain typographical errors. Final Clinical Impressions(s) / UC Diagnoses   Final diagnoses:  Sore throat  Nasal congestion  Infection, adenovirus     Discharge Instructions      Your symptoms are likely due to a viral respiratory infection. A respiratory infection is an illness that affects part of the respiratory system, such as the lungs, nose, or throat. Antibiotic medicines are not prescribed for viral infections. This is because antibiotics are designed to kill bacteria. They do not kill viruses. Take medications as prescribed to help manage your symptoms. You may take tylenol or ibuprofen as needed for fevers/headache/body aches. Drink plenty of fluids. Go to the ED immediately if you get worse or have any other symptoms.    Feel better soon!  Lelon Mast, FNP-C         ED Prescriptions     Medication Sig Dispense Auth. Provider   fluticasone (FLONASE) 50 MCG/ACT nasal spray Place 2 sprays into both nostrils daily. 9.9 mL Lurline Idol, FNP   pseudoephedrine (SUDAFED 12 HOUR) 120 MG 12 hr tablet Take 1 tablet (120 mg total) by mouth 2 (two) times daily. 14 tablet Lurline Idol, FNP   promethazine-dextromethorphan (PROMETHAZINE-DM) 6.25-15 MG/5ML syrup Take 5 mLs by mouth 4 (four) times daily as needed for cough. 118 mL Lurline Idol, FNP      PDMP not reviewed this encounter.   Lurline Idol, [place] 05/21/22 2049

## 2022-05-21 NOTE — ED Notes (Signed)
Dry throat and cough some sneezing, negative covid test, x 2 days.  

## 2022-05-21 NOTE — ED Triage Notes (Signed)
Dry throat and cough some sneezing, negative covid test, x 2 days.

## 2022-05-25 LAB — CULTURE, GROUP A STREP (THRC)

## 2022-08-25 IMAGING — US US OB < 14 WEEKS - US OB TV
1 series · 15 of 28 positions shown · non-contrast
Comparison: None.

CLINICAL DATA: Vaginal bleeding.  Pregnancy of unknown location

EXAM:
OBSTETRIC <14 WK US AND TRANSVAGINAL OB US
TECHNIQUE: Both transabdominal and transvaginal ultrasound examinations were
performed for complete evaluation of the gestation as well as the
maternal uterus, adnexal regions, and pelvic cul-de-sac.
Transvaginal technique was performed to assess early pregnancy.

[Series 1: us ob < 14 weeks - us ob tv · 15 of 57 slices shown]
[im 1/57]
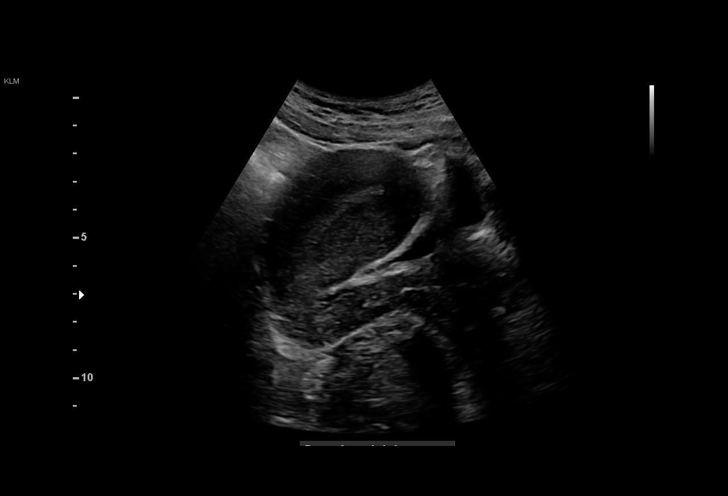
[im 5/57]
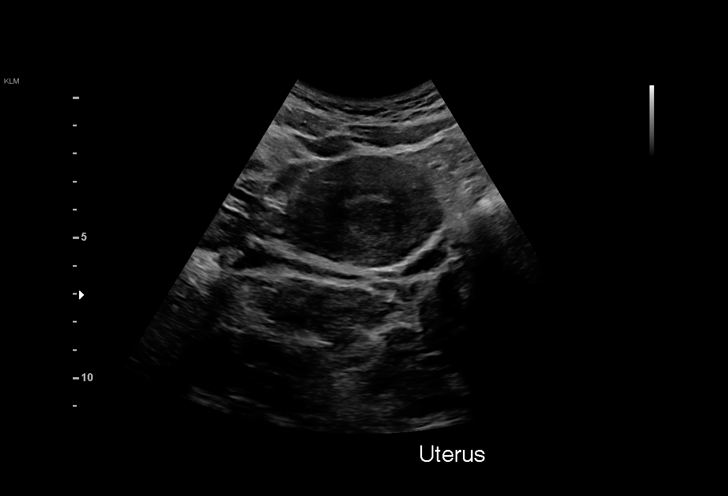
[im 9/57]
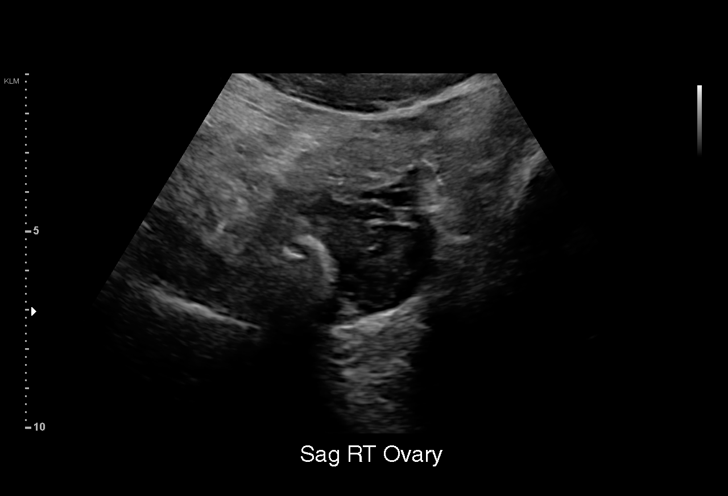
[im 13/57]
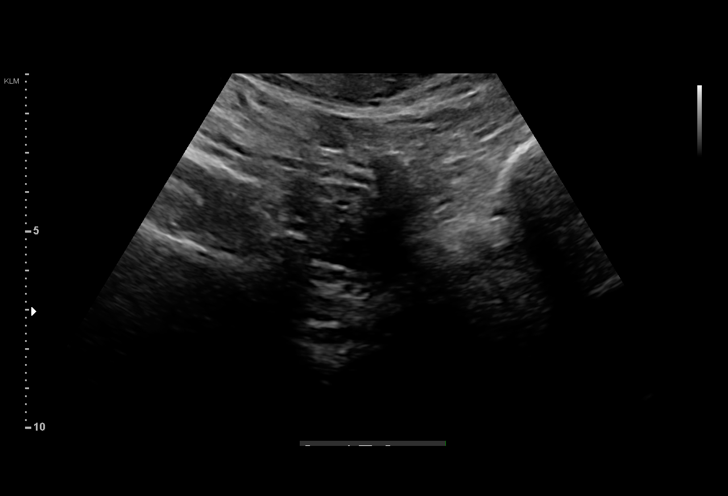
[im 17/57]
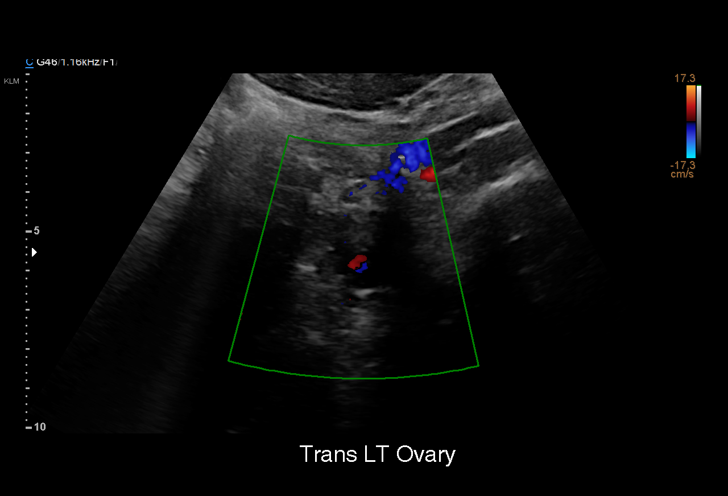
[im 21/57]
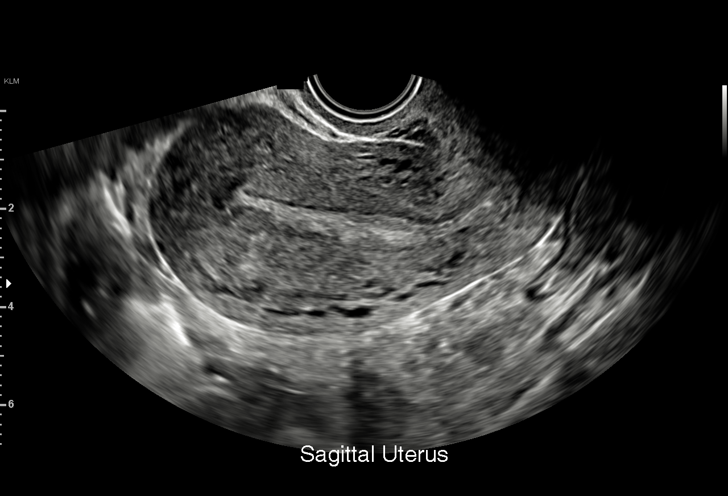
[im 25/57]
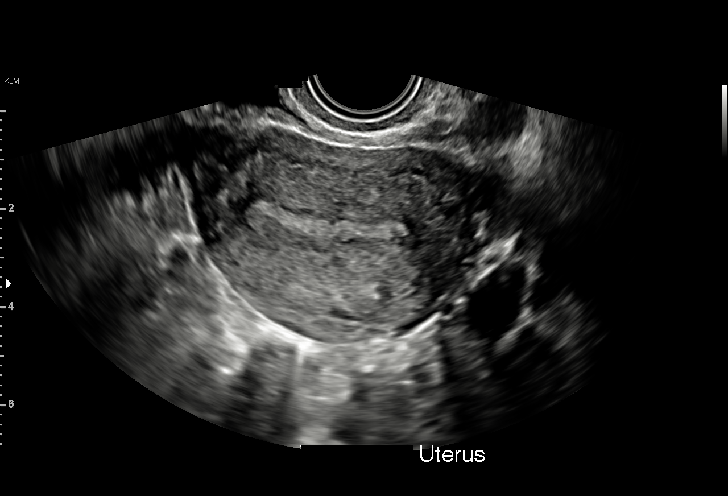
[im 30/57]
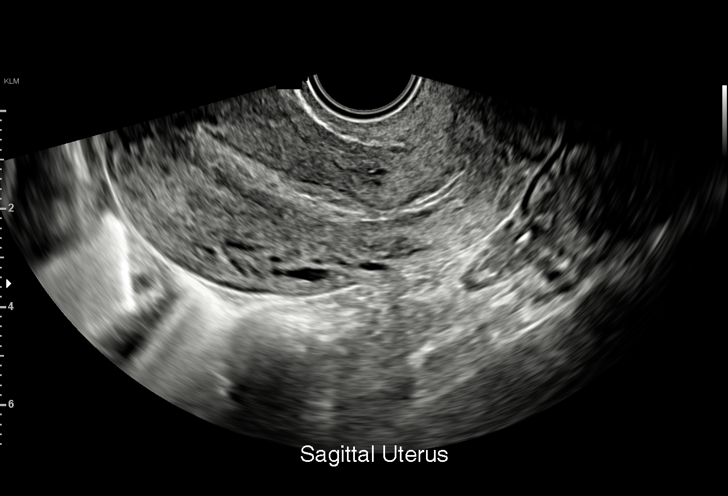
[im 32/57]
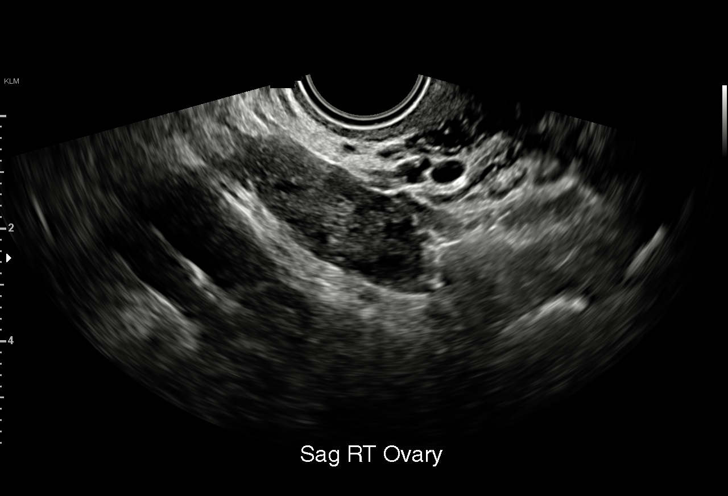
[im 36/57]
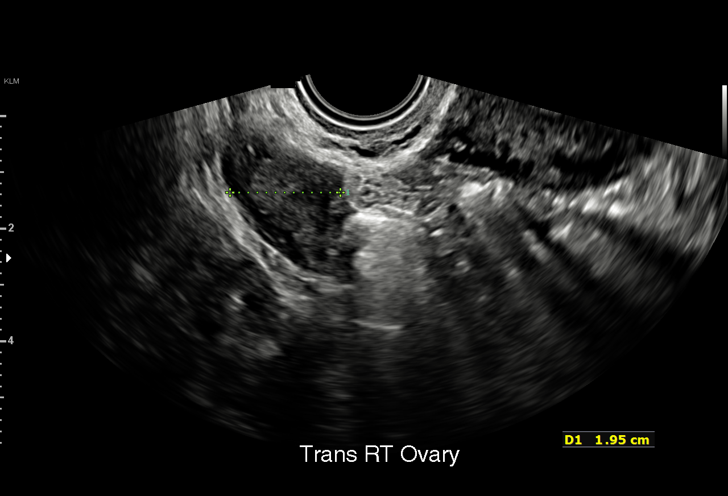
[im 40/57]
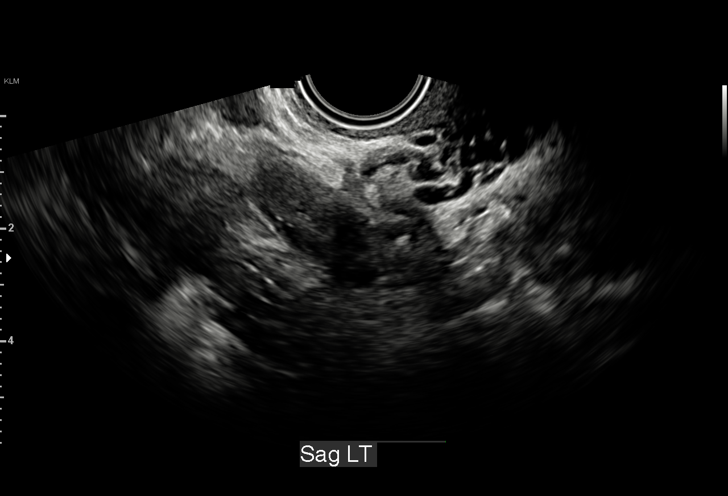
[im 44/57]
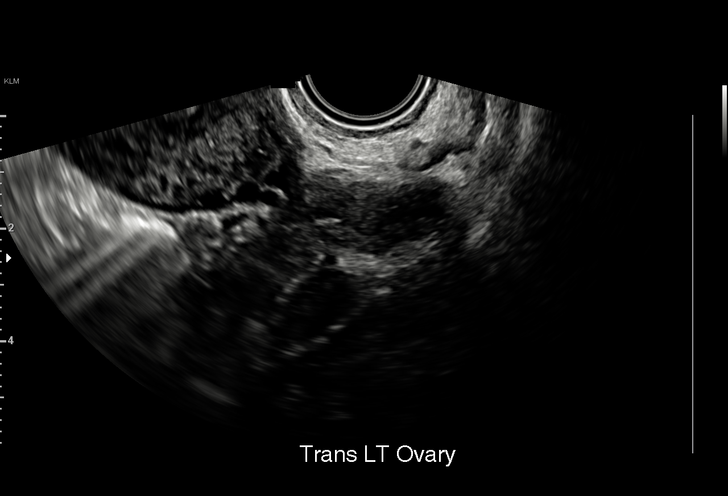
[im 48/57]
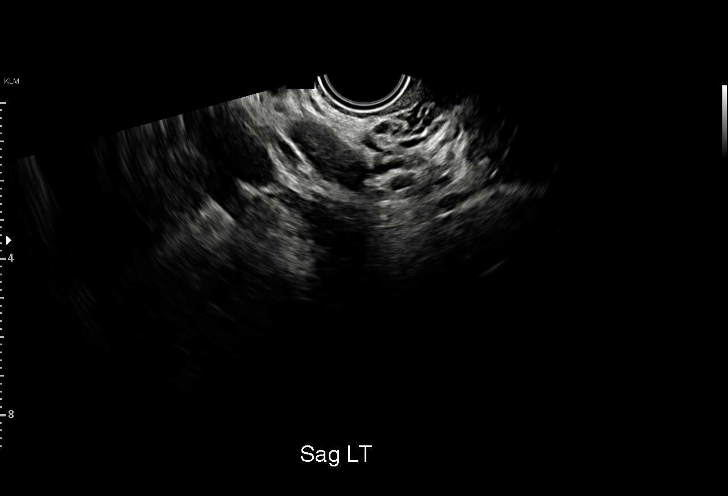
[im 52/57]
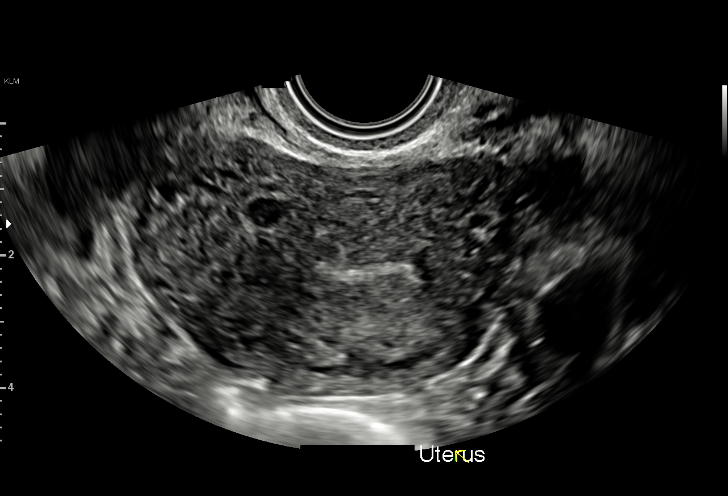
[im 57/57]
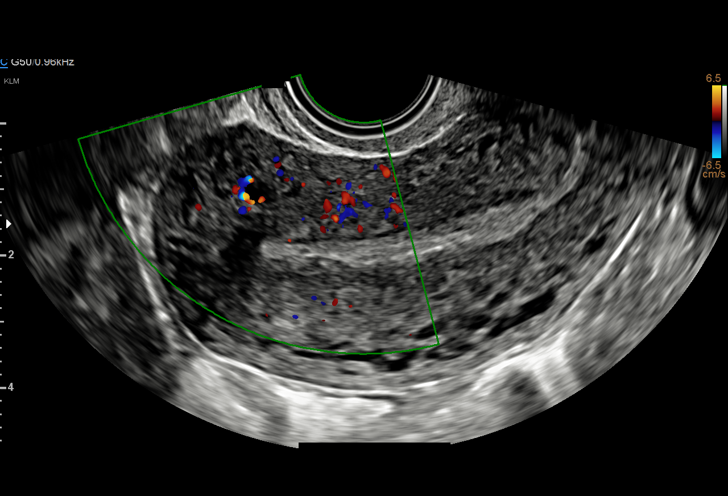

[15 of 28 positions shown; findings below may reference images not displayed]

FINDINGS: Intrauterine gestational sac: None

Yolk sac:  Not Visualized.

Embryo:  Not Visualized.

Cardiac Activity: Not Visualized.

Heart Rate:   bpm

MSD:   mm    w     d

CRL:    mm    w    d                  US EDC:

Subchorionic hemorrhage:  None visualized.

Maternal uterus/adnexae: No adnexal mass or free fluid.
IMPRESSION: No intrauterine pregnancy visualized. Differential considerations
would include early intrauterine pregnancy too early to visualize,
spontaneous abortion, or occult ectopic pregnancy. Recommend close
clinical followup and serial quantitative beta HCGs and ultrasounds.

## 2023-09-24 ENCOUNTER — Encounter: Payer: Self-pay | Admitting: Family Medicine

## 2023-09-24 ENCOUNTER — Other Ambulatory Visit: Payer: Self-pay

## 2023-09-24 ENCOUNTER — Ambulatory Visit (INDEPENDENT_AMBULATORY_CARE_PROVIDER_SITE_OTHER): Payer: Medicaid Other | Admitting: Family Medicine

## 2023-09-24 VITALS — BP 109/69 | HR 61 | Wt 189.0 lb

## 2023-09-24 DIAGNOSIS — Z3046 Encounter for surveillance of implantable subdermal contraceptive: Secondary | ICD-10-CM

## 2023-09-24 DIAGNOSIS — Z3009 Encounter for other general counseling and advice on contraception: Secondary | ICD-10-CM

## 2023-09-24 MED ORDER — NORGESTIMATE-ETH ESTRADIOL 0.25-35 MG-MCG PO TABS: 1.0000 | ORAL_TABLET | Freq: Every day | ORAL | 11 refills | Status: AC

## 2023-09-24 NOTE — Progress Notes (Signed)
     GYNECOLOGY OFFICE PROCEDURE NOTE  Erin Braun is a 27 y.o. G1P0 here for Nexplanon removal.  Due for pap smear but out of state insurance, message sent to [facility] to coordinate follow up.  Nexplanon removal Procedure Patient identified, informed consent performed, consent signed.   Appropriate time out taken. Nexplanon site identified.  Area prepped in usual sterile fashon. One ml of 1% lidocaine was used to anesthetize the area at the distal end of the implant. A small stab incision was made right beside the implant on the distal portion.  The Nexplanon rod was grasped using hemostats and removed without difficulty.  There was minimal blood loss. There were no complications.  3 ml of 1% lidocaine was injected around the incision for post-procedure analgesia.  Steri-strips were applied over the small incision.  A pressure bandage was applied to reduce any bruising.  The patient tolerated the procedure well and was given post procedure instructions.  Patient is planning to use OCP's for contraception/attempt conception.Rx sent to pharmacy.   Venora Maples, MD/[facility], [facility] for [employer], [facility]
